# Patient Record
Sex: Male | Born: 2000 | Race: Black or African American | Hispanic: No | Marital: Single | Smoking: Never smoker
Health system: Southern US, Community
[De-identification: ages and names within clinical notes are randomized; demographics above are authoritative.]

## PROBLEM LIST (undated history)

## (undated) DIAGNOSIS — J45909 Unspecified asthma, uncomplicated: Secondary | ICD-10-CM

## (undated) DIAGNOSIS — T7840XA Allergy, unspecified, initial encounter: Secondary | ICD-10-CM

## (undated) DIAGNOSIS — E669 Obesity, unspecified: Secondary | ICD-10-CM

## (undated) DIAGNOSIS — M542 Cervicalgia: Principal | ICD-10-CM

## (undated) DIAGNOSIS — G8929 Other chronic pain: Principal | ICD-10-CM

## (undated) HISTORY — DX: Allergy, unspecified, initial encounter: T78.40XA

## (undated) HISTORY — DX: Other chronic pain: G89.29

## (undated) HISTORY — DX: Obesity, unspecified: E66.9

## (undated) HISTORY — DX: Cervicalgia: M54.2

## (undated) HISTORY — DX: Unspecified asthma, uncomplicated: J45.909

## (undated) NOTE — *Deleted (*Deleted)
Asthma Continue Dulera 100-2 puffs twice a day with a spacer to prevent cough or wheeze Continue montelukast 10 mg once a day to prevent cough or wheeze Continue albuterol 2 puffs every 4 hours as needed for cough or wheeze You may use albuterol 2 puffs 5-15 minutes before exercise to decrease cough or wheeze  Allergic rhinitis Continue Flonase 2 sprays in each nostril once a day as needed for a stuffy nose.   Consider saline nasal rinses as needed for nasal symptoms. Use this before any medicated nasal sprays for best result  Allergic conjunctivitis Continue Pataday eye drops one drop in each eye once a day as needed for red, itchy eyes  Please let us know if this treatment plan is not working well for you   Schedule a follow up appointment in

---
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
## 2021-01-10 NOTE — Patient Instructions (Addendum)
Asthma For asthma flares/upper respiratory infections start Dulera 100-2 puffs twice a day with a spacer for 1-2 weeks or symptoms return to baseline Start montelukast 10 mg once a day to prevent cough or wheeze. Patient cautioned that rarely some children/adults can experience behavioral changes after beginning montelukast. These side effects are rare, however, if you notice any change, notify the clinic and discontinue montelukast. Continue albuterol 2 puffs every 4 hours as needed for cough or wheeze You may use albuterol 2 puffs 5-15 minutes before exercise to decrease cough or wheeze Asthma control goals:  Full participation in all desired activities (may need albuterol before activity) Albuterol use two time or less a week on average (not counting use with activity) Cough interfering with sleep two time or less a month Oral steroids no more than once a year No hospitalizations   Allergic rhinitis Continue avoidance measures directed toward pollens, dust mites, mold, cat, dog, and cockroach as listed below Continue Flonase 2 sprays in each nostril once a day as needed for a stuffy nose.  Consider saline nasal rinses as needed for nasal symptoms. Use this before any medicated nasal sprays for best result May use an over-the-counter antihistamine such as Claritin, Allegra, Xyzal, or Zyrtec once a day as needed for runny nose or itching  Allergic conjunctivitis Continue Pataday eye drops one drop in each eye once a day as needed for red, itchy eyes  Call the clinic if this treatment plan is not working well for you  Follow up in 3 months or sooner if needed.

## 2021-01-11 ENCOUNTER — Encounter: Payer: Self-pay | Admitting: Family

## 2021-01-11 ENCOUNTER — Ambulatory Visit (INDEPENDENT_AMBULATORY_CARE_PROVIDER_SITE_OTHER): Payer: Medicaid Other | Admitting: Family

## 2021-01-11 ENCOUNTER — Other Ambulatory Visit: Payer: Self-pay

## 2021-01-11 VITALS — BP 130/80 | HR 55 | Temp 98.0°F | Resp 16 | Ht 67.0 in | Wt 143.2 lb

## 2021-01-11 DIAGNOSIS — J302 Other seasonal allergic rhinitis: Secondary | ICD-10-CM | POA: Diagnosis not present

## 2021-01-11 DIAGNOSIS — J3089 Other allergic rhinitis: Secondary | ICD-10-CM

## 2021-01-11 DIAGNOSIS — H1013 Acute atopic conjunctivitis, bilateral: Secondary | ICD-10-CM | POA: Diagnosis not present

## 2021-01-11 DIAGNOSIS — J4540 Moderate persistent asthma, uncomplicated: Secondary | ICD-10-CM

## 2021-01-11 MED ORDER — MONTELUKAST SODIUM 10 MG PO TABS: 10.0000 mg | ORAL_TABLET | Freq: Every day | ORAL | 5 refills | Status: AC

## 2021-01-11 MED ORDER — ALBUTEROL SULFATE HFA 108 (90 BASE) MCG/ACT IN AERS: 1.0000 | INHALATION_SPRAY | RESPIRATORY_TRACT | 1 refills | Status: AC | PRN

## 2021-01-11 MED ORDER — MOMETASONE FURO-FORMOTEROL FUM 100-5 MCG/ACT IN AERO: INHALATION_SPRAY | RESPIRATORY_TRACT | 3 refills | Status: AC

## 2021-01-11 NOTE — Addendum Note (Signed)
Addended by: Orson Aloe on: 01/11/2021 04:32 PM   Modules accepted: Orders

## 2021-01-11 NOTE — Progress Notes (Signed)
[address] [place]o [place] [address] Dept: [phone]  FOLLOW UP NOTE  Patient ID: Dustin Kent, male    DOB: Jan 10, 2001  Age: 20 y.o. MRN: [id number] Date of Office Visit: 01/11/2021  Assessment  Chief Complaint: Allergic Rhinitis  (Some sneezing) and Asthma (No flares )  HPI Dustin Kent is a 20 year old male who presents today for follow-up of moderate persistent asthma without complication, allergic conjunctivitis, and seasonal and perennial allergic rhinitis.  He was last seen on July 28, 2019 by Thermon Leyland, FNP.  Moderate persistent asthma is reported as moderately controlled with albuterol as needed and rare use of Dulera 100 mcg.  He reports some wheezing with exercise and denies coughing, tightness in his chest, shortness of breath, and nocturnal awakenings.  He reports that he has been using his albuterol inhaler approximately 2-3 times a week before working out and after working out.  When asked how often he is using his Elwin Sleight he reports that he is really not using it that often and as not as often as he is using his albuterol.  He is not taking montelukast 10 mg once a day and when asked he reports there is no reason as to why he is not taking it.  Since his last office visit he has not required any systemic steroids or made any trips to the emergency room or urgent care due to breathing problems.  Seasonal and perennial allergic rhinitis is reported as moderately controlled with Flonase nasal spray as needed.  He does not take any antihistamines and is no longer getting allergy injections.  He reports that he stopped allergy injections due to having a hard time getting here and he also felt like he did not need them anymore.  He reports occasional rhinorrhea, occasional nasal congestion, and occasional postnasal drip.  He not had any sinus infections since we last saw him.  As he was getting ready to leave he asked if at his next office visit he could be re-skin tested to  environmental inhalants.  Instructed him that that would not be a problem.  Reminded him to remain off all antihistamines 3 days prior to that appointment.  Allergic conjunctivitis is reported as controlled with Pataday eyedrops.  He reports occasional itchy watery eyes.   Drug Allergies:  No Active Allergies  Review of Systems: Review of Systems  Constitutional:  Negative for chills and fever.  HENT:         Reports occasional rhinorrhea, nasal congestion, and postnasal drip  Eyes:        Reports occasional itchy watery eyes  Respiratory:  Positive for wheezing. Negative for cough and shortness of breath.        Reports occasional wheezing while working out.  Denies coughing, tightness in his chest, shortness of breath, and nocturnal awakening  Cardiovascular:  Negative for chest pain and palpitations.  Gastrointestinal:        Denies heartburn and reflux symptoms  Genitourinary:  Negative for dysuria.  Skin:  Positive for rash.       Reports that he will occasionally get a rash from the oils on his sheets if he does not wash his sheets frequently enough  Neurological:  Negative for headaches.  Endo/Heme/Allergies:  Positive for environmental allergies.    Physical Exam: BP 130/80   Pulse (!) 55   Temp 98 F (36.7 C)   Resp 16   Ht 5\' 7"  (1.702 m)   Wt 143 lb 3.2 oz (65 kg)  SpO2 97%   BMI 22.43 kg/m    Physical Exam Constitutional:      Appearance: Normal appearance.  HENT:     Head: Normocephalic and atraumatic.     Comments: Pharynx normal, eyes normal, ears normal, nose bilateral lower turbinates mildly edematous with no drainage noted    Right Ear: Tympanic membrane, ear canal and external ear normal.     Left Ear: Tympanic membrane, ear canal and external ear normal.  Eyes:     Conjunctiva/sclera: Conjunctivae normal.  Cardiovascular:     Rate and Rhythm: Regular rhythm.     Heart sounds: Normal heart sounds.  Pulmonary:     Effort: Pulmonary effort is  normal.     Breath sounds: Normal breath sounds.     Comments: Lungs clear to auscultation Musculoskeletal:     Cervical back: Neck supple.  Skin:    General: Skin is warm.  Neurological:     Mental Status: He is alert and oriented to person, place, and time.  Psychiatric:        Mood and Affect: Mood normal.        Behavior: Behavior normal.        Thought Content: Thought content normal.        Judgment: Judgment normal.    Diagnostics: FVC 3.75 L, FEV1 3.13 L.  Predicted FVC 4.15 L, predicted FEV1 3.58 L.  Spirometry indicates normal ventilatory function.  Assessment and Plan: 1. Moderate persistent asthma without complication   2. Seasonal and perennial allergic rhinitis   3. Allergic conjunctivitis of both eyes     No orders of the defined types were placed in this encounter.   Patient Instructions  Asthma For asthma flares/upper respiratory infections start Dulera 100-2 puffs twice a day with a spacer for 1-2 weeks or symptoms return to baseline Start montelukast 10 mg once a day to prevent cough or wheeze. Patient cautioned that rarely some children/adults can experience behavioral changes after beginning montelukast. These side effects are rare, however, if you notice any change, notify the clinic and discontinue montelukast. Continue albuterol 2 puffs every 4 hours as needed for cough or wheeze You may use albuterol 2 puffs 5-15 minutes before exercise to decrease cough or wheeze Asthma control goals:  Full participation in all desired activities (may need albuterol before activity) Albuterol use two time or less a week on average (not counting use with activity) Cough interfering with sleep two time or less a month Oral steroids no more than once a year No hospitalizations   Allergic rhinitis Continue avoidance measures directed toward pollens, dust mites, mold, cat, dog, and cockroach as listed below Continue Flonase 2 sprays in each nostril once a day as needed  for a stuffy nose.  Consider saline nasal rinses as needed for nasal symptoms. Use this before any medicated nasal sprays for best result May use an over-the-counter antihistamine such as Claritin, Allegra, Xyzal, or Zyrtec once a day as needed for runny nose or itching  Allergic conjunctivitis Continue Pataday eye drops one drop in each eye once a day as needed for red, itchy eyes  Call the clinic if this treatment plan is not working well for you  Follow up in 3 months or sooner if needed.  Return in about 3 months (around 04/13/2021), or if symptoms worsen or fail to improve.    Thank you for the opportunity to care for this patient.  Please do not hesitate to contact me with questions.  Wynona Canes  Amada Jupiter, FNP [facility]
